# Patient Record
Sex: Female | Born: 1965 | Race: Black or African American | Hispanic: No | Marital: Married | Smoking: Never smoker
Health system: Southern US, Community
[De-identification: ages and names within clinical notes are randomized; demographics above are authoritative.]

## PROBLEM LIST (undated history)

## (undated) DIAGNOSIS — I10 Essential (primary) hypertension: Secondary | ICD-10-CM

---

## 2010-12-31 ENCOUNTER — Ambulatory Visit: Payer: Self-pay | Admitting: Internal Medicine

## 2011-07-17 ENCOUNTER — Emergency Department (INDEPENDENT_AMBULATORY_CARE_PROVIDER_SITE_OTHER)
Admission: EM | Admit: 2011-07-17 | Discharge: 2011-07-17 | Disposition: A | Discharge status: Home / Self Care | Payer: 59 | Source: Home / Self Care | Attending: Family Medicine | Admitting: Family Medicine

## 2011-07-17 ENCOUNTER — Encounter (HOSPITAL_COMMUNITY): Payer: Self-pay | Admitting: *Deleted

## 2011-07-17 DIAGNOSIS — J069 Acute upper respiratory infection, unspecified: Secondary | ICD-10-CM

## 2011-07-17 HISTORY — DX: Essential (primary) hypertension: I10

## 2011-07-17 MED ORDER — GUAIFENESIN-CODEINE 100-10 MG/5ML PO SYRP: 5.0000 mL | ORAL_SOLUTION | Freq: Four times a day (QID) | ORAL | Status: AC | PRN

## 2011-07-17 MED ORDER — AZITHROMYCIN 250 MG PO TABS: 250.0000 mg | ORAL_TABLET | Freq: Every day | ORAL | Status: AC

## 2011-07-17 NOTE — ED Provider Notes (Signed)
History     CSN: [id number]  Arrival date & time 07/17/11  1311   First MD Initiated Contact with Patient 07/17/11 1314      Chief Complaint  Patient presents with  . Sore Throat    (Consider location/radiation/quality/duration/timing/severity/associated sxs/prior treatment) HPI Comments: The patient reports having a sore throat x 1 wk. Some runny nose and cough. Cough is non productive. She denies fever. Taking otc meds with slight relief. No n/v /d/c  The history is provided by the patient.    Past Medical History  Diagnosis Date  . Hypertension     Past Surgical History  Procedure Date  . Cesarean section     No family history on file.  History  Substance Use Topics  . Smoking status: Not on file  . Smokeless tobacco: Not on file  . Alcohol Use: No    OB History    Grav Para Term Preterm Abortions TAB SAB Ect Mult Living                  Review of Systems  Constitutional: Negative.   HENT: Positive for congestion, rhinorrhea and postnasal drip. Negative for sore throat.   Respiratory: Positive for cough.   Cardiovascular: Negative.   Gastrointestinal: Negative.   Skin: Negative.   Neurological: Positive for headaches.    Allergies  Review of patient's allergies indicates not on file.  Home Medications   Current Outpatient Rx  Name Route Sig Dispense Refill  . AZITHROMYCIN 250 MG PO TABS Oral Take 1 tablet (250 mg total) by mouth daily. Take first 2 tablets together, then 1 every day until finished. 6 tablet 0  . GUAIFENESIN-CODEINE 100-10 MG/5ML PO SYRP Oral Take 5 mLs by mouth every 6 (six) hours as needed for cough. 120 mL 0    BP 156/92  Pulse 93  Temp(Src) 99.4 F (37.4 C) (Oral)  Resp 20  SpO2 100%  LMP 07/17/2011  Physical Exam  Nursing note and vitals reviewed. Constitutional: She appears well-developed and well-nourished. No distress.  HENT:  Head: Normocephalic.  Mouth/Throat: Oropharynx is clear and moist.       Ears clear,  nose congested, throat mild erythema. Voice at just above a whisper  Neck: Neck supple.  Cardiovascular: Normal rate, regular rhythm and normal heart sounds.   Pulmonary/Chest: Effort normal and breath sounds normal. She has no wheezes.  Lymphadenopathy:    She has no cervical adenopathy.  Skin: Skin is warm.    ED Course  Procedures (including critical care time)  Labs Reviewed - No data to display No results found.   1. URI (upper respiratory infection)       MDM          Randa Spike, MD 07/17/11 1350

## 2011-07-17 NOTE — ED Notes (Signed)
Pt  Reports     Symptoms    Of       sorethroat       Cough     Congested      Mucus   Production       Pt       Has   Symptoms  opf  Losing  Her  Voice  As  Well       Symptoms  Have  Persisted      For  About 1  Week  And  Have not  Been releived  By otc  meds

## 2011-07-17 NOTE — Discharge Instructions (Signed)
Tylenol or motrin as needed. Avoid caffeine and milk products. Follow up with your pcp or return if symptmos persist. mucinex dm recommened.

## 2013-05-17 ENCOUNTER — Encounter (HOSPITAL_COMMUNITY): Payer: Self-pay | Admitting: Emergency Medicine

## 2013-05-17 ENCOUNTER — Emergency Department (HOSPITAL_COMMUNITY)
Admission: EM | Admit: 2013-05-17 | Discharge: 2013-05-17 | Disposition: A | Discharge status: Home / Self Care | Payer: BC Managed Care – PPO | Attending: Emergency Medicine | Admitting: Emergency Medicine

## 2013-05-17 DIAGNOSIS — Z79899 Other long term (current) drug therapy: Secondary | ICD-10-CM | POA: Insufficient documentation

## 2013-05-17 DIAGNOSIS — I10 Essential (primary) hypertension: Secondary | ICD-10-CM | POA: Insufficient documentation

## 2013-05-17 DIAGNOSIS — Z3202 Encounter for pregnancy test, result negative: Secondary | ICD-10-CM | POA: Insufficient documentation

## 2013-05-17 DIAGNOSIS — R030 Elevated blood-pressure reading, without diagnosis of hypertension: Secondary | ICD-10-CM

## 2013-05-17 DIAGNOSIS — R51 Headache: Secondary | ICD-10-CM | POA: Insufficient documentation

## 2013-05-17 DIAGNOSIS — IMO0001 Reserved for inherently not codable concepts without codable children: Secondary | ICD-10-CM

## 2013-05-17 LAB — COMPREHENSIVE METABOLIC PANEL
ALBUMIN: 3.5 g/dL (ref 3.5–5.2)
ALT: 12 U/L (ref 0–35)
AST: 13 U/L (ref 0–37)
Alkaline Phosphatase: 87 U/L (ref 39–117)
BILIRUBIN TOTAL: 0.2 mg/dL — AB (ref 0.3–1.2)
BUN: 12 mg/dL (ref 6–23)
CHLORIDE: 101 meq/L (ref 96–112)
CO2: 26 mEq/L (ref 19–32)
CREATININE: 0.72 mg/dL (ref 0.50–1.10)
Calcium: 9.1 mg/dL (ref 8.4–10.5)
GFR calc Af Amer: >90 mL/min (ref >90)
GFR calc non Af Amer: >90 mL/min (ref >90)
Glucose, Bld: 162 mg/dL — ABNORMAL HIGH (ref 70–99)
POTASSIUM: 3.9 meq/L (ref 3.7–5.3)
Sodium: 137 mEq/L (ref 137–147)
TOTAL PROTEIN: 7.3 g/dL (ref 6.0–8.3)

## 2013-05-17 LAB — CBC
HCT: 37.2 % (ref 36.0–46.0)
Hemoglobin: 12 g/dL (ref 12.0–15.0)
MCH: 23.2 pg — ABNORMAL LOW (ref 26.0–34.0)
MCHC: 32.3 g/dL (ref 30.0–36.0)
MCV: 71.8 fL — AB (ref 78.0–100.0)
PLATELETS: 256 10*3/uL (ref 150–400)
RBC: 5.18 MIL/uL — ABNORMAL HIGH (ref 3.87–5.11)
RDW: 16.2 % — AB (ref 11.5–15.5)
WBC: 5.2 10*3/uL (ref 4.0–10.5)

## 2013-05-17 LAB — PREGNANCY, URINE: Preg Test, Ur: NEGATIVE

## 2013-05-17 NOTE — ED Provider Notes (Signed)
CSN: [id number]     Arrival date & time 05/17/13  4098 History   First MD Initiated Contact with Patient 05/17/13 [phone]     Chief Complaint  Patient presents with  . Hypertension     (Consider location/radiation/quality/duration/timing/severity/associated sxs/prior Treatment) The history is provided by the patient. No language interpreter was used.  Lynn Clay is a 48 y/o F with PMHx of HTN presenting to the ED with elevated blood pressure for the past couple of days. Patient reported that when she stopped taking her blood pressure medications, Lisinopril, back in November of 2014. Reported that over the past 2 weeks she has not been eating that great - reported that she has been increasing her intake of fatty, greasy and salty foods. Reported that she has been having a headache intermittently to the back of her head and eyes - described as gnawing sensation. Patient reported that she has taking her Lisinopril again - reported 10 mg tablets daily - reported that she started taking the medication this week. Reported that she took her blood pressure yesterday with a reading of 163/94 and this morning was 151/100. Denied headache at this time - reported that she is feeling well. Denied blurred vision, sudden loss of vision, myalgias, chest pain, shortness of breath, difficulty breathing, abdominal pain, nausea, vomiting, diarrhea.  PCP Dr. Luciana Axe  Past Medical History  Diagnosis Date  . Hypertension    Past Surgical History  Procedure Laterality Date  . Cesarean section     Family History  Problem Relation Age of Onset  . Cancer Sister    History  Substance Use Topics  . Smoking status: Never Smoker   . Smokeless tobacco: Not on file  . Alcohol Use: No   OB History   Grav Para Term Preterm Abortions TAB SAB Ect Mult Living                 Review of Systems  Constitutional: Negative for fever and chills.  Eyes: Negative for visual disturbance.  Respiratory: Negative for  chest tightness and shortness of breath.   Cardiovascular: Negative for chest pain.  Gastrointestinal: Negative for nausea, vomiting and abdominal pain.  Musculoskeletal: Negative for back pain and neck pain.  Neurological: Positive for headaches. Negative for weakness and numbness.  All other systems reviewed and are negative.      Allergies  Review of patient's allergies indicates no known allergies.  Home Medications   Current Outpatient Rx  Name  Route  Sig  Dispense  Refill  . lisinopril (PRINIVIL,ZESTRIL) 10 MG tablet   Oral   Take 10 mg by mouth daily.          BP 139/75  Pulse 85  Temp(Src) 98.4 F (36.9 C) (Oral)  Resp 19  SpO2 100%  LMP 03/03/2013 Physical Exam  Nursing note and vitals reviewed. Constitutional: She is oriented to person, place, and time. She appears well-developed and well-nourished. No distress.  HENT:  Head: Normocephalic and atraumatic.  Mouth/Throat: Oropharynx is clear and moist. No oropharyngeal exudate.  Eyes: Conjunctivae and EOM are normal. Pupils are equal, round, and reactive to light. Right eye exhibits no discharge. Left eye exhibits no discharge.  Neck: Normal range of motion. Neck supple. No tracheal deviation present.  Cardiovascular: Normal rate, regular rhythm and normal heart sounds.  Exam reveals no friction rub.   No murmur heard. Pulses:      Radial pulses are 2+ on the right side, and 2+ on the left side.  Dorsalis pedis pulses are 2+ on the right side, and 2+ on the left side.  Pulmonary/Chest: Effort normal and breath sounds normal. No respiratory distress. She has no wheezes. She has no rales.  Musculoskeletal: Normal range of motion.  Full ROM to upper and lower extremities without difficulty noted, negative ataxia noted.  Lymphadenopathy:    She has no cervical adenopathy.  Neurological: She is alert and oriented to person, place, and time. No cranial nerve deficit. She exhibits normal muscle tone.  Coordination normal.  Cranial nerves III-XII grossly intact Strength 5+/5+ to upper and lower extremities bilaterally with resistance applied, equal distribution noted Sensation intact with differentiation to sharp and dull touch Equal grip strength  Negative arm drift Fine motor skills intact Heel to knee down shin normal bilaterally Gait proper, proper balance - negative sway, negative drift, negative step-offs  Skin: Skin is warm and dry. No rash noted. She is not diaphoretic. No erythema.  Psychiatric: She has a normal mood and affect. Her behavior is normal. Thought content normal.    ED Course  Procedures (including critical care time)  Results for orders placed during the hospital encounter of 05/17/13  CBC      Result Value Ref Range   WBC 5.2  4.0 - 10.5 K/uL   RBC 5.18 (*) 3.87 - 5.11 MIL/uL   Hemoglobin 12.0  12.0 - 15.0 g/dL   HCT 16.137.2  09.636.0 - 04.546.0 %   MCV 71.8 (*) 78.0 - 100.0 fL   MCH 23.2 (*) 26.0 - 34.0 pg   MCHC 32.3  30.0 - 36.0 g/dL   RDW 40.916.2 (*) 81.111.5 - 91.415.5 %   Platelets 256  150 - 400 K/uL  COMPREHENSIVE METABOLIC PANEL      Result Value Ref Range   Sodium 137  137 - 147 mEq/L   Potassium 3.9  3.7 - 5.3 mEq/L   Chloride 101  96 - 112 mEq/L   CO2 26  19 - 32 mEq/L   Glucose, Bld 162 (*) 70 - 99 mg/dL   BUN 12  6 - 23 mg/dL   Creatinine, Ser 7.820.72  0.50 - 1.10 mg/dL   Calcium 9.1  8.4 - 95.610.5 mg/dL   Total Protein 7.3  6.0 - 8.3 g/dL   Albumin 3.5  3.5 - 5.2 g/dL   AST 13  0 - 37 U/L   ALT 12  0 - 35 U/L   Alkaline Phosphatase 87  39 - 117 U/L   Total Bilirubin 0.2 (*) 0.3 - 1.2 mg/dL   GFR calc non Af Amer >90  >90 mL/min   GFR calc Af Amer >90  >90 mL/min  PREGNANCY, URINE      Result Value Ref Range   Preg Test, Ur NEGATIVE  NEGATIVE    Labs Review Labs Reviewed  CBC - Abnormal; Notable for the following:    RBC 5.18 (*)    MCV 71.8 (*)    MCH 23.2 (*)    RDW 16.2 (*)    All other components within normal limits  COMPREHENSIVE METABOLIC  PANEL - Abnormal; Notable for the following:    Glucose, Bld 162 (*)    Total Bilirubin 0.2 (*)    All other components within normal limits  PREGNANCY, URINE   Imaging Review No results found.   EKG Interpretation   Date/Time:  Tuesday May 17 2013 07:13:53 EDT Ventricular Rate:  83 PR Interval:  139 QRS Duration: 89 QT Interval:  354 QTC Calculation:  416 R Axis:   41 Text Interpretation:  Sinus rhythm Borderline T abnormalities, anterior  leads No previous ECGs available Confirmed by ZACKOWSKI  MD, SCOTT [phone])  on 05/17/2013 8:11:25 AM      MDM   Final diagnoses:  Elevated blood pressure   Filed Vitals:   05/17/13 0830 05/17/13 0915 05/17/13 0930 05/17/13 0944  BP: 143/84  139/75   Pulse:      Temp:      TempSrc:      Resp: 16 16 27 19   SpO2:        Patient presenting to the ED with elevated blood pressure and intermittent headache described as a gnawing sensation to the back of the head and eyes. Patient reported that she has a history of HTN, but reported that she stopped taking her medications in November 2014. Reported that she has been starting to take medications starting this week - Lisinopril 10 mg daily. Reported that she has been increasing grease and salty foods daily.  Alert and oriented. GCS 15. Heart rate and rhythm normal. Lungs clear to auscultation. Radial and DP 2+ bilaterally. Equal grip strength. Full ROM to upper and lower extremities bilaterally without difficulty or ataxia. Sensation intact.  EKG noted normal sinus rhythm with heart rate of T wave abnormalities - no previous EKG - negative ischemic findings noted. CBC negative elevated white blood cell count identified. CMP liver and kidney functioning well. Urine pregnancy negative. Negative findings of endorgan damage. Negative focal neurological deficits identified. Patient does not have headache while in ED setting. Blood pressure controlled while in ED setting-139/75 was last blood pressure  reading. Patient has medication at home. Patient has primary care provider. Patient stable, afebrile. Discharged patient. Discussed with patient to rest and stay hydrated. Discussed with patient to continue to take blood pressure medications as prescribed. Referred patient to primary care provider to be reassessed by the end of the week and to discuss elevated blood pressure with primary care provider. Discussed with patient DASH diet. Discussed with patient to closely monitor symptoms and if symptoms are to worsen or change to report back to the ED - strict return instructions given.  Patient agreed to plan of care, understood, all questions answered.   Raymon Mutton, PA-C 05/17/13 1744

## 2013-05-17 NOTE — Discharge Instructions (Signed)
Please call your doctor for a followup appointment within 24-48 hours. When you talk to your doctor please let them know that you were seen in the emergency department and have them acquire all of your records so that they can discuss the findings with you and formulate a treatment plan to fully care for your new and ongoing problems. Please call and set-up an appointment with your primary care provider to be seen and re-assessed  please continue to take your blood pressure medications as prescribed Please continue to monitor blood pressure Please avoid any physical or strenuous activity Please decrease intake of fatty, greasy foods - please decrease salt intake Please increase water intake Please follow DASH diet Please continue to monitor symptoms and if symptoms are to worsen or change (fever greater than 101, chills, sweating, nausea, vomiting, diarrhea, chest pain, shortness of breath, difficulty breathing, headache, blurred vision, sudden loss of vision, fainting, weakness) please report back to the ED immediately   Hypertension As your heart beats, it forces blood through your arteries. This force is your blood pressure. If the pressure is too high, it is called hypertension (HTN) or high blood pressure. HTN is dangerous because you may have it and not know it. High blood pressure may mean that your heart has to work harder to pump blood. Your arteries may be narrow or stiff. The extra work puts you at risk for heart disease, stroke, and other problems.  Blood pressure consists of two numbers, a higher number over a lower, 110/72, for example. It is stated as "110 over 72." The ideal is below 120 for the top number (systolic) and under 80 for the bottom (diastolic). Write down your blood pressure today. You should pay close attention to your blood pressure if you have certain conditions such as:  Heart failure.  Prior heart attack.  Diabetes  Chronic kidney disease.  Prior  stroke.  Multiple risk factors for heart disease. To see if you have HTN, your blood pressure should be measured while you are seated with your arm held at the level of the heart. It should be measured at least twice. A one-time elevated blood pressure reading (especially in the Emergency Department) does not mean that you need treatment. There may be conditions in which the blood pressure is different between your right and left arms. It is important to see your caregiver soon for a recheck. Most people have essential hypertension which means that there is not a specific cause. This type of high blood pressure may be lowered by changing lifestyle factors such as:  Stress.  Smoking.  Lack of exercise.  Excessive weight.  Drug/tobacco/alcohol use.  Eating less salt. Most people do not have symptoms from high blood pressure until it has caused damage to the body. Effective treatment can often prevent, delay or reduce that damage. TREATMENT  When a cause has been identified, treatment for high blood pressure is directed at the cause. There are a large number of medications to treat HTN. These fall into several categories, and your caregiver will help you select the medicines that are best for you. Medications may have side effects. You should review side effects with your caregiver. If your blood pressure stays high after you have made lifestyle changes or started on medicines,   Your medication(s) may need to be changed.  Other problems may need to be addressed.  Be certain you understand your prescriptions, and know how and when to take your medicine.  Be sure to  follow up with your caregiver within the time frame advised (usually within two weeks) to have your blood pressure rechecked and to review your medications.  If you are taking more than one medicine to lower your blood pressure, make sure you know how and at what times they should be taken. Taking two medicines at the same time  can result in blood pressure that is too low. SEEK IMMEDIATE MEDICAL CARE IF:  You develop a severe headache, blurred or changing vision, or confusion.  You have unusual weakness or numbness, or a faint feeling.  You have severe chest or abdominal pain, vomiting, or breathing problems. MAKE SURE YOU:   Understand these instructions.  Will watch your condition.  Will get help right away if you are not doing well or get worse. Document Released: 02/03/2005 Document Revised: 04/28/2011 Document Reviewed: 09/24/2007 [facility] Patient Information 2014 [place], [place].  DASH Diet The DASH diet stands for "Dietary Approaches to Stop Hypertension." It is a healthy eating plan that has been shown to reduce high blood pressure (hypertension) in as little as 14 days, while also possibly providing other significant health benefits. These other health benefits include reducing the risk of breast cancer after menopause and reducing the risk of type 2 diabetes, heart disease, colon cancer, and stroke. Health benefits also include weight loss and slowing kidney failure in patients with chronic kidney disease.  DIET GUIDELINES  Limit salt (sodium). Your diet should contain less than 1500 mg of sodium daily.  Limit refined or processed carbohydrates. Your diet should include mostly whole grains. Desserts and added sugars should be used sparingly.  Include small amounts of heart-healthy fats. These types of fats include nuts, oils, and tub margarine. Limit saturated and trans fats. These fats have been shown to be harmful in the body. CHOOSING FOODS  The following food groups are based on a 2000 calorie diet. See your Registered Dietitian for individual calorie needs. Grains and Grain Products (6 to 8 servings daily)  Eat More Often: Whole-wheat bread, brown rice, whole-grain or wheat pasta, quinoa, popcorn without added fat or salt (air popped).  Eat Less Often: White bread, white pasta, white rice,  cornbread. Vegetables (4 to 5 servings daily)  Eat More Often: Fresh, frozen, and canned vegetables. Vegetables may be raw, steamed, roasted, or grilled with a minimal amount of fat.  Eat Less Often/Avoid: Creamed or fried vegetables. Vegetables in a cheese sauce. Fruit (4 to 5 servings daily)  Eat More Often: All fresh, canned (in natural juice), or frozen fruits. Dried fruits without added sugar. One hundred percent fruit juice ( cup [237 mL] daily).  Eat Less Often: Dried fruits with added sugar. Canned fruit in light or heavy syrup. [employer], Fish, and Poultry (2 servings or less daily. One serving is 3 to 4 oz [85-114 g]).  Eat More Often: Ninety percent or leaner ground beef, tenderloin, sirloin. Round cuts of beef, chicken breast, [place] breast. All fish. Grill, bake, or broil your meat. Nothing should be fried.  Eat Less Often/Avoid: Fatty cuts of meat, [place], or chicken leg, thigh, or wing. Fried cuts of meat or fish. Dairy (2 to 3 servings)  Eat More Often: Low-fat or fat-free milk, low-fat plain or light yogurt, reduced-fat or part-skim cheese.  Eat Less Often/Avoid: Milk (whole, 2%).Whole milk yogurt. Full-fat cheeses. Nuts, Seeds, and Legumes (4 to 5 servings per week)  Eat More Often: All without added salt.  Eat Less Often/Avoid: Salted nuts and seeds, canned beans with  added salt. Fats and Sweets (limited)  Eat More Often: Vegetable oils, tub margarines without trans fats, sugar-free gelatin. Mayonnaise and salad dressings.  Eat Less Often/Avoid: Coconut oils, palm oils, butter, stick margarine, cream, half and half, cookies, candy, pie. FOR MORE INFORMATION The Dash Diet Eating Plan: www.dashdiet.org Document Released: 01/23/2011 Document Revised: 04/28/2011 Document Reviewed: 01/23/2011 [facility] Patient Information 2014 [place], [place].   Emergency Department Resource Guide 1) Find a Doctor and Pay Out of Pocket Although you won't have to find out who is  covered by your insurance plan, it is a good idea to ask around and get recommendations. You will then need to call the office and see if the doctor you have chosen will accept you as a new patient and what types of options they offer for patients who are self-pay. Some doctors offer discounts or will set up payment plans for their patients who do not have insurance, but you will need to ask so you aren't surprised when you get to your appointment.  2) Contact Your Local Health Department Not all health departments have doctors that can see patients for sick visits, but many do, so it is worth a call to see if yours does. If you don't know where your local health department is, you can check in your phone book. The CDC also has a tool to help you locate your state's health department, and many state websites also have listings of all of their local health departments.  3) Find a Walk-in Clinic If your illness is not likely to be very severe or complicated, you may want to try a walk in clinic. These are popping up all over the country in pharmacies, drugstores, and shopping centers. They're usually staffed by nurse practitioners or physician assistants that have been trained to treat common illnesses and complaints. They're usually fairly quick and inexpensive. However, if you have serious medical issues or chronic medical problems, these are probably not your best option.  No Primary Care Doctor: - Call Health Connect at  [phone] - they can help you locate a primary care doctor that  accepts your insurance, provides certain services, etc. - Physician Referral Service- [phone]  Chronic Pain Problems: Organization         Address  Phone   Notes  [facility]  [phone] Patients need to be referred by their primary care doctor.   Medication Assistance: Organization         Address  Phone   Notes  [facility] Medication [facility] [address] [place] [place], [place] [address] [phone] --Must be a resident of [facility] -- Must have NO insurance coverage whatsoever (no Medicaid/ Medicare, etc.) -- The pt. MUST have a primary care doctor that directs their care regularly and follows them in the community   [facility]  [phone]   [employer]  [phone]    Agencies that provide inexpensive medical care: Organization         Address  Phone   Notes  [facility]  [phone]   [facility]    [phone]   [facility] [address] [place], [place] [address] [phone]   [facility] [address] [place] [place] [phone]   Planned Parenthood    [phone]   [facility]    [phone]   [facility]  [address], [place] Phone:  [phone], Fax:  [phone] Hours of Operation:  9 am - 6 pm, M-F.  Also accepts Medicaid/Medicare and self-pay.  [facility]  [address], [place] Phone: [phone], Fax: [phone]. Hours of Operation:  8:30 am - 5:30 pm, M-F.  Also accepts Medicaid and self-pay.  [facility] [place] [address], [place] Phone: [phone]   [facility] [address] Natasha Bence [place], [place] [phone], Ext. [phone] Mondays & Thursdays: 7-9 AM.  First 15 patients are seen on a first come, first serve basis.    Medicaid-accepting [facility] Providers:  Organization         Address  Phone   Notes  [facility] [address],  [phone] Also accepts self-pay patients.  [facility] [address] Laurell Josephs [place], [place]  [phone]   [facility] [address], [place] [phone]   [facility] [address] [place] [phone]   Renaye Rakers [address], [place]   [phone] Only accepts [place] Access [place] patients after they have their name applied to their card.   Self-Pay (no insurance) in [facility]:  Organization         Address  Phone   Notes  Sickle Cell Patients, [facility] [address] [place], [place] [phone]   [facility] [address] [place], [place] [phone]   [facility] [place]  [address],  [phone]   [facility]/Dr. Osei-Bonsu  [address] [place] or [address], [place] [phone] Phone number for both [place] and [place] locations is the same.  [facility] [address] [place] [phone]   [facility] [address], [place] or [address] [phone]   [facility] [address] [place] [phone], phone; [phone], fax Sees patients 1st and 3rd Saturday of every month.  Must not qualify for public or private insurance (i.e. Medicaid, Medicare, [place] Health Choice, Veterans' Benefits)  Household income should be no more than 200% of the poverty level The clinic cannot treat you if you are pregnant or think you are pregnant  Sexually transmitted diseases are not treated at the clinic.    Dental Care: Organization         Address  Phone  Notes  [facility] Department of [facility] [address] [place], [place] [phone] Accepts children up to age 62 who are enrolled in [place] or [place] Health Choice; pregnant women with a Medicaid card; and children who have applied for Medicaid or [place] Health Choice, but were declined, whose parents can pay a reduced fee at time of service.  [facility]  [address], [place] [phone] Accepts children up to age 43 who are enrolled  in [place] or [place] Health Choice; pregnant women with a Medicaid card; and children who have applied for Medicaid or  Health Choice, but were declined, whose parents can pay a reduced fee at time of service.  [facility] Adult Dental Access PROGRAM  [address] [place], [place] [phone] Patients are  seen by appointment only. Walk-ins are not accepted. [facility] will see patients 28 years of age and older. Monday - Tuesday (8am-5pm) Most Wednesdays (8:30-5pm) $30 per visit, cash only  [facility] Adult Dental Access PROGRAM  [address], [facility] [phone] Patients are seen by appointment only. Walk-ins are not accepted. [facility] will see patients 51 years of age and older. One Wednesday Evening (Monthly: Volunteer Based).  $30 per visit, cash only  [employer]  [phone] for adults; Children under age 45, call Graduate Pediatric Dentistry at [phone]. Children aged 25-14, please call [phone] to request a pediatric application.  Dental services are provided in all areas of dental care including fillings, crowns and bridges, complete and partial dentures, implants, gum treatment, root canals, and extractions. Preventive care is also provided. Treatment is provided to both adults and children. Patients are selected via a lottery and there is often a waiting list.   [facility] [address], [place]  [phone] [email or web address]   [facility] [address] [place], [place] [phone], Ext. [phone] Second and Fourth Thursday of each month, opens at 6:30 AM; Clinic ends at 9 AM.  Patients are seen on a first-come first-served basis, and a limited number are seen during each clinic.   [facility]  [address] Ether Griffins [place], [place] [phone]   Eligibility Requirements You must have lived in [place], [place], or [place] counties for at least the last three months.   You cannot be  eligible for state or federal sponsored [place], including [employer], [place], or [employer].   You generally cannot be eligible for healthcare insurance through your employer.    How to apply: Eligibility screenings are held every Tuesday and Wednesday afternoon from 1:00 pm until 4:00 pm. You do not need an appointment for the interview!  [facility] [address] [place], [place] [phone]   [facility] Health Department  [phone]   [facility] Health Department  [phone]   [facility] Health Department  [phone]    Behavioral Health Resources in the Community: Intensive Outpatient Programs Organization         Address  Phone  Notes  [facility] [address], [place], [place] [phone]   [facility] Outpatient [address] [place], [place] [phone]   [facility] [address] [place], [place]  [phone]   [facility] Mental Health [address],  [place], [place] [phone] or [phone]   Substance Abuse Resources Organization         Address  Phone  Notes  [facility]  [phone]   [facility]  [phone]   [facility]  [phone]   Floydene Flock  [phone]   Residential & Outpatient Substance Abuse Program  6130434528   Psychological Services Organization         Address  Phone  Notes  [facility]al Health  [phone]   [facility]  [phone]   [facility] [address], [place] [phone] or [phone]    Mobile Crisis Teams Organization         Address  Phone  Notes  [facility], Mobile Crisis Care Unit  [phone]   [facility]  [address] [place], [place] [phone]   [facility] [address] [place]  [phone]    Self-Help/Support Groups Organization          Address  Phone             Notes  [facility] [place] - variety of support groups  336- I7437963 Call for more information  Narcotics Anonymous (NA), [facility] [address], [employer] [place]  2 meetings at this location   Statistician         Address  Phone  Notes  [facility] [address],    [place]  [phone]   [facility]  [address] [place] [address], [place], [place] [phone]   [facility] [address] [place], [place] [phone] Admissions: 8am-3pm M-F  [facility] [address]    [place], [place] [phone]   [facility] [address] [place], [place], [place] [phone]   [facility] [address],  [place], [place] [phone]   [facility] - Intensive Outpatient [address] Laurell Joseph[address], [place], [place] [phone]   [facility] ([facility] [address]  [place], [place] [phone] or [phone]   [facility] ([facility]) [address], [place], [place] [phone] Accepts Medicaid  [facility] [address]  [place] [phone] Substance Abuse/Addiction Treatment   [facility] Organization         Address  Phone  Notes  [facility]  [phone]   Angie Fava, PhD [address] Ervin Knack [place], [place]   [phone] or [phone]   [facility] Behavioral   [address] [place], [place] [phone]   [facility] [address], [place], [place] [phone] Insurance/Medicaid/sponsorship through [facility] [address]                                    [place], [place] [phone] Therapy/tele-psych/case  [facility] [address], [place] [phone]    Dr. Lolly Mustache  [phone]   [facility] [place] [facility]  [employer]  [facility] Dept. 1) [address] [place] 2) [address] [place] 3)  [address], [place] [phone]  [phone]   [facility] Child Abuse Hotline 2183202101 or [phone] (After Hours)

## 2013-05-17 NOTE — ED Notes (Signed)
Pt states her blood pressure has been elevated  Pt states she used to take lisinopril but has not taken it since November until this week because she felt like her blood pressure was elevated  Pt states she has been eating more salty and fatty foods over the past couple of weeks  Pt states she has been getting headaches in the back of her head  Pt states she checked her b/p last night and this morning and it was elevated

## 2013-05-18 NOTE — ED Provider Notes (Signed)
Medical screening examination/treatment/procedure(s) were performed by non-physician practitioner and as supervising physician I was immediately available for consultation/collaboration.   EKG Interpretation   Date/Time:  Tuesday May 17 2013 07:13:53 EDT Ventricular Rate:  83 PR Interval:  139 QRS Duration: 89 QT Interval:  354 QTC Calculation: 416 R Axis:   41 Text Interpretation:  Sinus rhythm Borderline T abnormalities, anterior  leads No previous ECGs available Confirmed by Deretha EmoryZACKOWSKI  MD, SCOTT ([id number])  on 05/17/2013 8:11:25 AM        Lynn CiscoMegan E Cambelle Suchecki, MD 05/18/13 [phone]

## 2016-03-22 ENCOUNTER — Encounter (HOSPITAL_COMMUNITY): Payer: Self-pay | Admitting: Emergency Medicine

## 2016-03-22 ENCOUNTER — Emergency Department (HOSPITAL_COMMUNITY): Payer: BC Managed Care – PPO

## 2016-03-22 ENCOUNTER — Emergency Department (HOSPITAL_COMMUNITY)
Admission: EM | Admit: 2016-03-22 | Discharge: 2016-03-22 | Disposition: A | Discharge status: Home / Self Care | Payer: BC Managed Care – PPO | Attending: Emergency Medicine | Admitting: Emergency Medicine

## 2016-03-22 DIAGNOSIS — W231XXA Caught, crushed, jammed, or pinched between stationary objects, initial encounter: Secondary | ICD-10-CM | POA: Insufficient documentation

## 2016-03-22 DIAGNOSIS — S99922A Unspecified injury of left foot, initial encounter: Secondary | ICD-10-CM | POA: Insufficient documentation

## 2016-03-22 DIAGNOSIS — Y929 Unspecified place or not applicable: Secondary | ICD-10-CM | POA: Insufficient documentation

## 2016-03-22 DIAGNOSIS — I10 Essential (primary) hypertension: Secondary | ICD-10-CM | POA: Insufficient documentation

## 2016-03-22 DIAGNOSIS — Y999 Unspecified external cause status: Secondary | ICD-10-CM | POA: Diagnosis not present

## 2016-03-22 DIAGNOSIS — Y939 Activity, unspecified: Secondary | ICD-10-CM | POA: Diagnosis not present

## 2016-03-22 MED ORDER — IBUPROFEN 600 MG PO TABS: 600.0000 mg | ORAL_TABLET | Freq: Four times a day (QID) | ORAL | 0 refills | Status: AC | PRN

## 2016-03-22 NOTE — ED Notes (Signed)
Declined W/C at D/C and was escorted to lobby by RN. 

## 2016-03-22 NOTE — ED Provider Notes (Signed)
[facility]-EMERGENCY DEPT Provider Note   CSN: [id number] Arrival date & time: 03/22/16  1230  By signing my name below, I, Lynn Clay, attest that this documentation has been prepared under the direction and in the presence of [employer]. Electronically Signed: Sonum Clay, Neurosurgeon. 03/22/16. 3:46 PM.  History   Chief Complaint Chief Complaint  Patient presents with  . Toe Injury  . Toe Pain    The history is provided by the patient. No language interpreter was used.     HPI Comments: Lynn Clay is a 51 y.o. female who presents to the Emergency Department complaining of constant, unchanged left 5th toe pain with associated improved swelling that began earlier today. She states the injury occurred when her toe became jammed under a chair leg. She states the pain is worse with standing and ambulation. She denies numbness.    Past Medical History:  Diagnosis Date  . Hypertension     There are no active problems to display for this patient.   Past Surgical History:  Procedure Laterality Date  . CESAREAN SECTION      OB History    No data available       Home Medications    Prior to Admission medications   Medication Sig Start Date End Date Taking? Authorizing Provider  ibuprofen (ADVIL,MOTRIN) 600 MG tablet Take 1 tablet (600 mg total) by mouth every 6 (six) hours as needed. 03/22/16   Georgiana Shore, PA-C  lisinopril (PRINIVIL,ZESTRIL) 10 MG tablet Take 10 mg by mouth daily.    Historical Provider, MD    Family History Family History  Problem Relation Age of Onset  . Cancer Sister     Social History Social History  Substance Use Topics  . Smoking status: Never Smoker  . Smokeless tobacco: Never Used  . Alcohol use No     Allergies   Patient has no known allergies.   Review of Systems Review of Systems  Musculoskeletal: Positive for arthralgias and joint swelling.  Skin: Negative for wound.  Neurological: Negative for weakness and  numbness.     Physical Exam Updated Vital Signs BP 152/85 (BP Location: Right Arm)   Pulse 78   Temp 98.6 F (37 C) (Oral)   Resp 16   Ht 5\' 4"  (1.626 m)   Wt 85.7 kg   LMP 03/03/2013 Comment: irregular  SpO2 100%   BMI 32.44 kg/m   Physical Exam  Constitutional: She is oriented to person, place, and time. She appears well-developed and well-nourished. No distress.  Afebrile and non-toxic appearing. Sitting comfortably in chair and in no acute distress.   HENT:  Head: Normocephalic and atraumatic.  Cardiovascular: Normal rate, regular rhythm and normal heart sounds.  Exam reveals no gallop and no friction rub.   No murmur heard. Pulmonary/Chest: Effort normal and breath sounds normal. No respiratory distress. She has no wheezes. She has no rales.  Musculoskeletal: Normal range of motion. She exhibits tenderness. She exhibits no edema or deformity.  No swelling. Full ROM. Tenderness to palpation of the left 5th toe.   Neurological: She is alert and oriented to person, place, and time. No sensory deficit.  Skin: Skin is warm and dry.  Psychiatric: She has a normal mood and affect.  Nursing note and vitals reviewed.    ED Treatments / Results  DIAGNOSTIC STUDIES: Oxygen Saturation is 99% on RA, normal by my interpretation.    COORDINATION OF CARE: 3:40 PM Discussed treatment plan with pt at bedside  and pt agreed to plan.   Labs (all labs ordered are listed, but only abnormal results are displayed) Labs Reviewed - No data to display  EKG  EKG Interpretation None       Radiology Dg Foot Complete Left  Result Date: 03/22/2016 CLINICAL DATA:  Pt reports the leg of a dining room chair fell onto the top of her left foot last night; pt now complains of pain to the lateral side of left foot and is unable to bear weight on it; difficulty moving 4th and 5th toes without pain. EXAM: LEFT FOOT - COMPLETE 3+ VIEW COMPARISON:  None. FINDINGS: There is no evidence of fracture or  dislocation. There is no evidence of arthropathy or other focal bone abnormality. Soft tissues are unremarkable. IMPRESSION: Negative. Electronically Signed   By: Norva PavlovElizabeth  Brown M.D.   On: 03/22/2016 15:23    Procedures Procedures (including critical care time)  Medications Ordered in ED Medications - No data to display   Initial Impression / Assessment and Plan / ED Course  I have reviewed the triage vital signs and the nursing notes.  Pertinent labs & imaging results that were available during my care of the patient were reviewed by me and considered in my medical decision making (see chart for details).    51 year old female presenting with acute onset left little toe pain. A chair leg crush her toe as she was attempting to move a table. Plain films negative for acute fracture. Brisk cap refills. On exam she has no swelling, erythema, color change. Neurovascularly intact. Patient reported a difference in sensation in the affected toe when compared to the other foot, but more as a throbbing pain than decreased sensation.  Toe was buddy taped by nursing. Patient's husband requested a boot for her foot so that she couldn't injure it further. Explained that there was no indication to immobilize her foot at this time. Suggested a larger shoe with padding until the discomfort subsides and RICE protocol.  Discharge home with close follow-up with PCP and [facility]  Discussed strict return precautions. Patient was advised to return to the emergency department if experiencing any worsening of symptoms. Patient understood instructions and agreed with discharge plan.   Final Clinical Impressions(s) / ED Diagnoses   Final diagnoses:  Injury of toe on left foot, initial encounter    New Prescriptions New Prescriptions   IBUPROFEN (ADVIL,MOTRIN) 600 MG TABLET    Take 1 tablet (600 mg total) by mouth every 6 (six) hours as needed.   I personally performed the services described in this  documentation, which was scribed in my presence. The recorded information has been reviewed and is accurate.    Georgiana ShoreJessica B Libbi Towner, PA-C 03/22/16 1639    Lorre NickAnthony Allen, MD 03/26/16 [phone]

## 2016-03-22 NOTE — ED Triage Notes (Signed)
Pt. Stated, we were moving a table and it hit my left litle toe

## 2016-03-22 NOTE — ED Notes (Addendum)
Pt brought back from xray. 

## 2017-09-30 IMAGING — DX DG FOOT COMPLETE 3+V*L*
3 series · 3 of 3 positions shown · non-contrast
Comparison: None.

CLINICAL DATA: Pt reports the leg of a dining room chair fell onto
the top of her left foot last night; pt now complains of pain to the
lateral side of left foot and is unable to bear weight on it;
difficulty moving 4th and 5th toes without pain.

EXAM:
LEFT FOOT - COMPLETE 3+ VIEW

[foot ap]
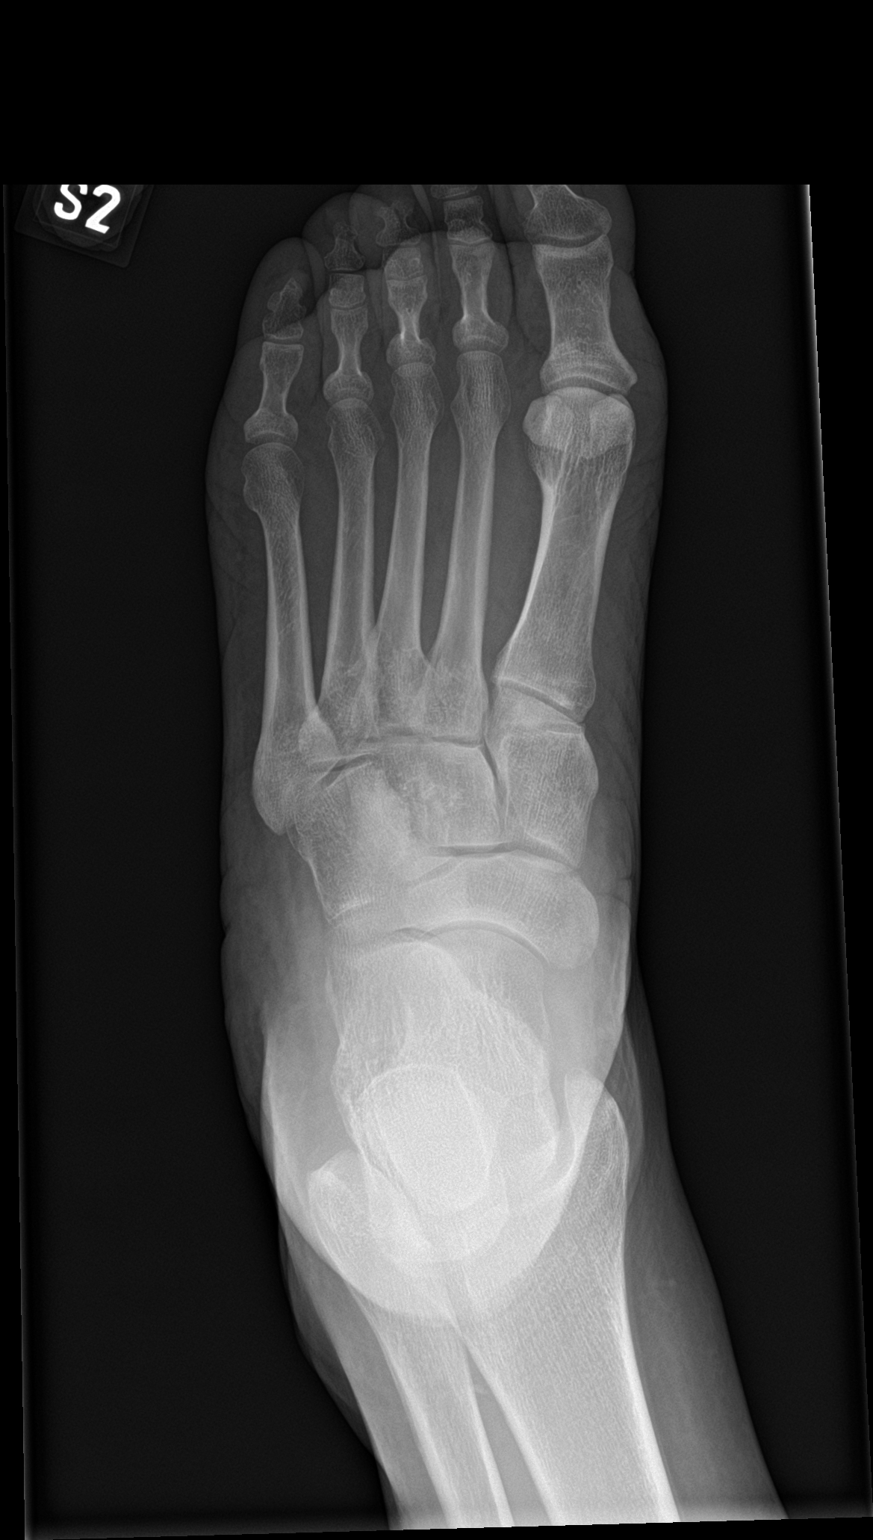

[foot obl]
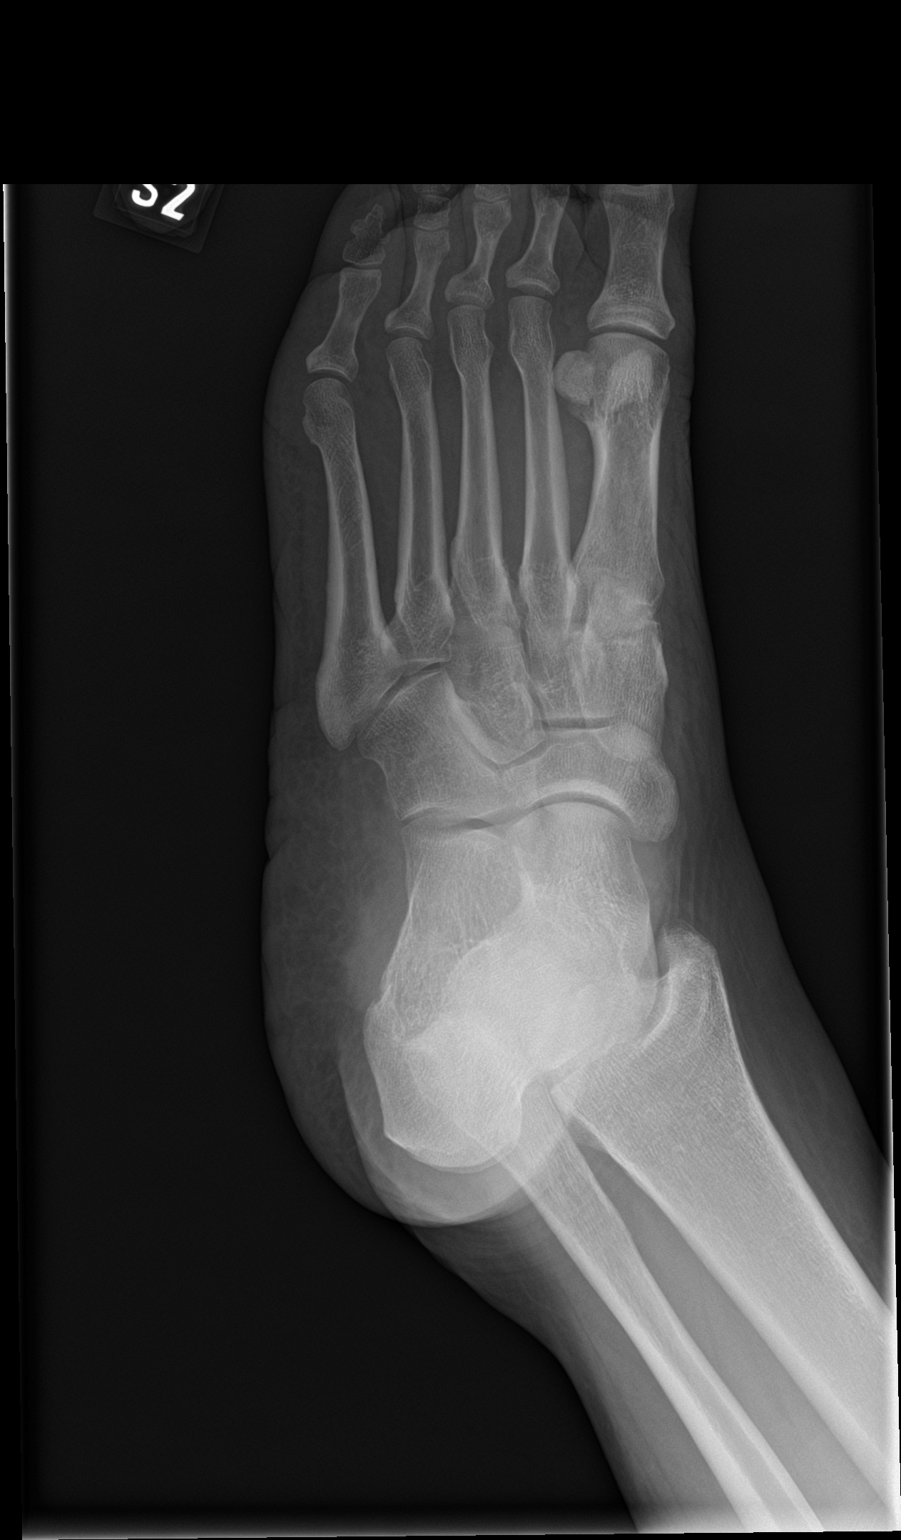

[foot lat]
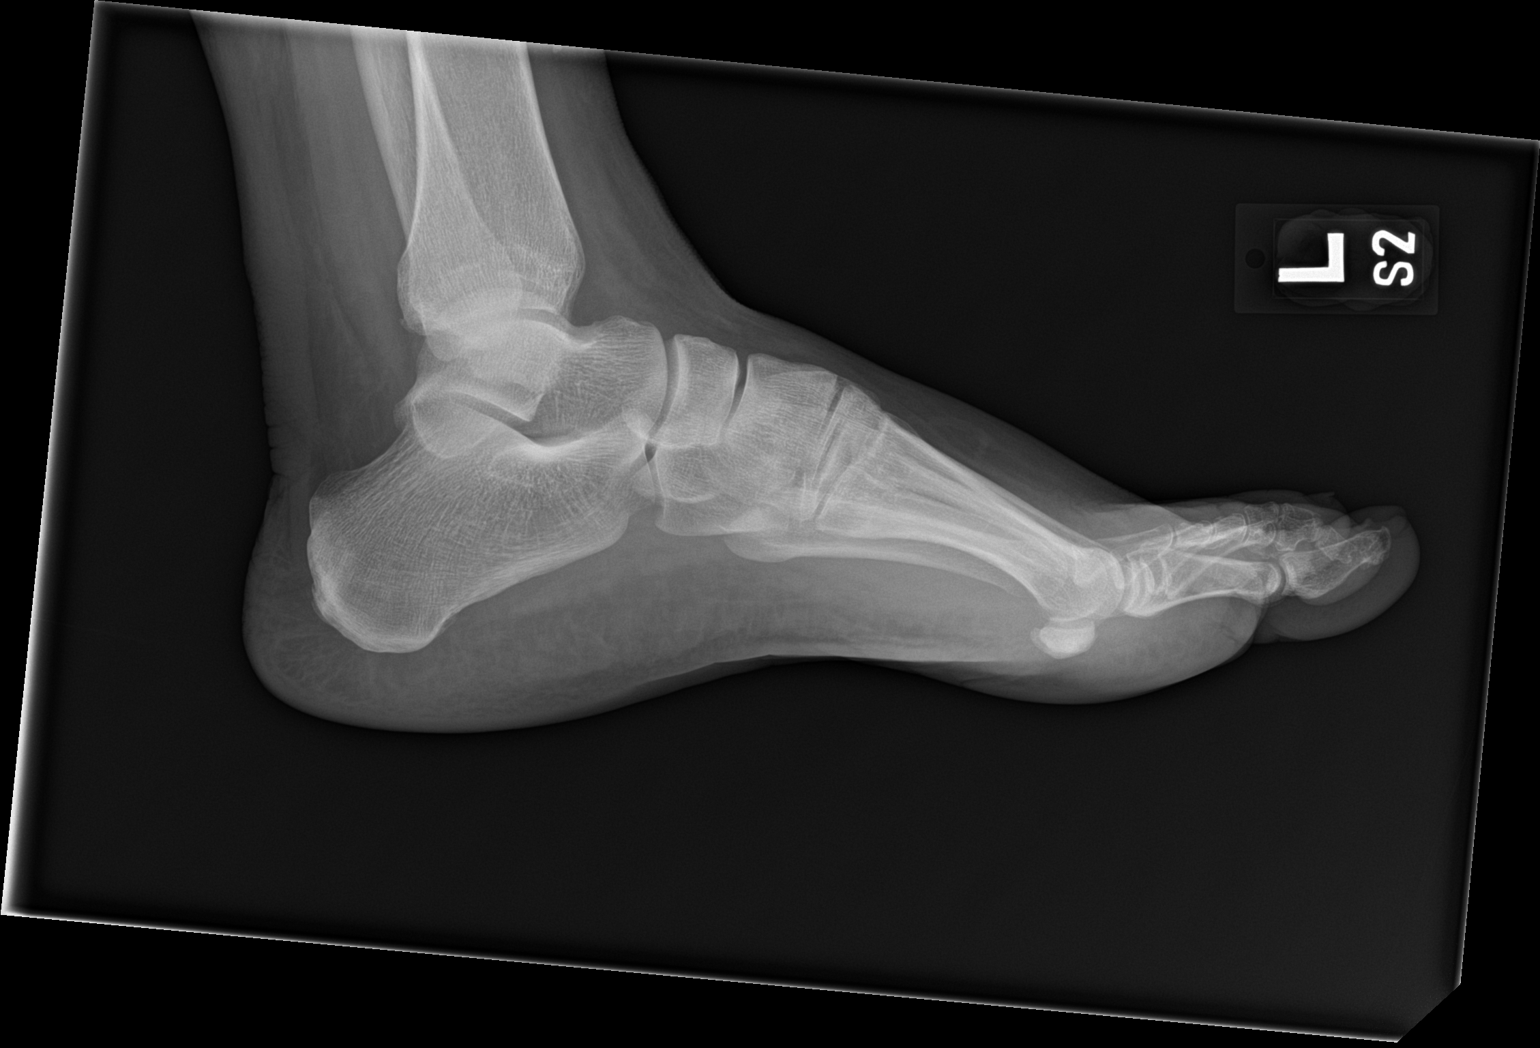

[3 of 3 positions shown; findings below may reference images not displayed]

FINDINGS: There is no evidence of fracture or dislocation. There is no
evidence of arthropathy or other focal bone abnormality. Soft
tissues are unremarkable.
IMPRESSION: Negative.

## 2019-04-11 ENCOUNTER — Ambulatory Visit: Payer: BC Managed Care – PPO | Attending: Internal Medicine

## 2019-04-11 DIAGNOSIS — Z20822 Contact with and (suspected) exposure to covid-19: Secondary | ICD-10-CM

## 2019-04-12 LAB — NOVEL CORONAVIRUS, NAA: SARS-CoV-2, NAA: NOT DETECTED

## 2019-04-16 ENCOUNTER — Ambulatory Visit: Payer: BC Managed Care – PPO | Attending: Internal Medicine

## 2019-04-16 DIAGNOSIS — Z23 Encounter for immunization: Secondary | ICD-10-CM | POA: Insufficient documentation

## 2019-04-16 NOTE — Progress Notes (Signed)
   Covid-19 Vaccination Clinic  Name:  Lynn Clay    MRN: [id number] DOB: [birth year]  04/16/2019  Ms. Lynn Clay was observed post Covid-19 immunization for 15 minutes without incidence. She was provided with Vaccine Information Sheet and instruction to access the V-Safe system.   Ms. Lynn Clay was instructed to call 911 with any severe reactions post vaccine: Marland Kitchen Difficulty breathing  . Swelling of your face and throat  . A fast heartbeat  . A bad rash all over your body  . Dizziness and weakness    Immunizations Administered    Name Date Dose VIS Date Route   Pfizer COVID-19 Vaccine 04/16/2019  2:10 PM 0.3 mL 01/28/2019 Intramuscular   Manufacturer: [employer], [employer]   Lot: [id number]   NDC: [address]-5

## 2019-05-07 ENCOUNTER — Ambulatory Visit: Payer: BC Managed Care – PPO | Attending: Internal Medicine

## 2019-05-07 DIAGNOSIS — Z23 Encounter for immunization: Secondary | ICD-10-CM

## 2019-05-07 NOTE — Progress Notes (Signed)
   Covid-19 Vaccination Clinic  Name:  Lynn Clay    MRN: [id number] DOB: [birth year]  05/07/2019  [name] was observed post Covid-19 immunization for 15 minutes without incident. She was provided with Vaccine Information Sheet and instruction to access the V-Safe system.   [name] was instructed to call 911 with any severe reactions post vaccine: Marland Kitchen Difficulty breathing  . Swelling of face and throat  . A fast heartbeat  . A bad rash all over body  . Dizziness and weakness   Immunizations Administered    Name Date Dose VIS Date Route   Pfizer COVID-19 Vaccine 05/07/2019  8:11 AM 0.3 mL 01/28/2019 Intramuscular   Manufacturer: [employer], [employer]   Lot: [id number]   NDC: [address]-5
